# Patient Record
Sex: Male | Born: 1990 | Race: Black or African American | Hispanic: No | Marital: Single | Smoking: Never smoker
Health system: Southern US, Community
[De-identification: ages and names within clinical notes are randomized; demographics above are authoritative.]

## PROBLEM LIST (undated history)

## (undated) DIAGNOSIS — R1030 Lower abdominal pain, unspecified: Secondary | ICD-10-CM

## (undated) DIAGNOSIS — Z9889 Other specified postprocedural states: Secondary | ICD-10-CM

## (undated) DIAGNOSIS — Z8719 Personal history of other diseases of the digestive system: Secondary | ICD-10-CM

## (undated) HISTORY — DX: Personal history of other diseases of the digestive system: Z87.19

## (undated) HISTORY — DX: Lower abdominal pain, unspecified: R10.30

## (undated) HISTORY — DX: Other specified postprocedural states: Z98.890

---
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
## 2012-09-17 ENCOUNTER — Encounter (HOSPITAL_COMMUNITY): Payer: Self-pay | Admitting: *Deleted

## 2012-09-17 ENCOUNTER — Emergency Department (INDEPENDENT_AMBULATORY_CARE_PROVIDER_SITE_OTHER)
Admission: EM | Admit: 2012-09-17 | Discharge: 2012-09-17 | Disposition: A | Discharge status: Home / Self Care | Payer: Self-pay | Source: Home / Self Care

## 2012-09-17 ENCOUNTER — Emergency Department (INDEPENDENT_AMBULATORY_CARE_PROVIDER_SITE_OTHER): Payer: Self-pay

## 2012-09-17 DIAGNOSIS — S4980XA Other specified injuries of shoulder and upper arm, unspecified arm, initial encounter: Secondary | ICD-10-CM

## 2012-09-17 DIAGNOSIS — S46009A Unspecified injury of muscle(s) and tendon(s) of the rotator cuff of unspecified shoulder, initial encounter: Secondary | ICD-10-CM

## 2012-09-17 MED ORDER — OXYCODONE-ACETAMINOPHEN 5-325 MG PO TABS: 1.0000 | ORAL_TABLET | Freq: Four times a day (QID) | ORAL | Status: AC | PRN

## 2012-09-17 MED ORDER — KETOROLAC TROMETHAMINE 60 MG/2ML IM SOLN
60.0000 mg | Freq: Once | INTRAMUSCULAR | Status: AC
  Administered 2012-09-17: 60 mg via INTRAMUSCULAR

## 2012-09-17 MED ORDER — KETOROLAC TROMETHAMINE 60 MG/2ML IM SOLN
INTRAMUSCULAR | Status: AC
  Filled 2012-09-17: qty 2

## 2012-09-17 MED ORDER — NAPROXEN 500 MG PO TABS: 500.0000 mg | ORAL_TABLET | Freq: Two times a day (BID) | ORAL | Status: AC

## 2012-09-17 NOTE — ED Provider Notes (Signed)
CC:  Left shoulder injury:  HPI:  Shoulder hurting for 1 week but on Sunday (3 days ago), he was lifting a large box (about 50 lbs) overhead and felt his left shoulder bone pop out forward and then pop back in with severe pain. Pain started in back of shoulder/shoulder blade then moved to entire shoulder - anterior, posterior, superior. Pain is  8/10 now and radiates down arm. Numbness/stiffness in first and second digits left hand. Abduction past 90 degrees, flexion past 60-75 degrees cause pain. Pt has taken naproxen, which helps some. No past injuries to that shoulder.  No swelling.   PMH:  none PSH:  bilateral inguinal hernia repair Fam Hx:  Non-contributory Soc Hx:  Occasional cigarette, no alcohol or drugs.  ROS:  Negative except as stated in HPI.  MEDS:   No current facility-administered medications on file prior to encounter.   Current Outpatient Prescriptions on File Prior to Encounter  Medication Sig Dispense Refill  . cyclobenzaprine (FLEXERIL) 5 MG tablet Take 5 mg by mouth as needed.         No Known Allergies  PHYSICAL EXAM Filed Vitals:   09/17/12 1935  BP: 110/54  Pulse: 54  Temp: 98.2 F (36.8 C)  Resp: 14   GEN:  WNWD, no acute distress RRR, CTAB Musculoskeletal:  L shoulder tenderness posterior, anterior, and superior aspects of the joint and over the deltoid. There is no tenderness along the humerus, elbow, wrist or hand. Pain with passive and active abduction past about 80 degrees, flexion past about 60 degrees. Non pain with resisted internal rotation but some with resisted external rotation. Decreased sensation in left thumb and index finger to the wrist compared to right. But other digits, hand, forearm, upper arm and shoulder sensation normal. Full movement and strength of fingers. Normal pulses at wrist. Neck and back non-tender.  XRAY LEFT SHOULDER - 2+ VIEW  Comparison: None.  Findings: No evidence for fracture. No findings to suggest  shoulder  separation or dislocation. No worrisome lytic or sclerotic  osseous lesion.   IMPRESSION:  Normal exam.  Original Report Authenticated By: ERIC A. MANSELL, M.D.   Toradol 60 mg IM given in ED - minimal relief.  A/P 21 y.o. African American male with shoulder injury Rotator cuff vs labral tear from dislocation, shoulder instability -may need MRI. Finger numbness - likely injured nerve with transient dislocation No fracture or bony abnormality, no dislocation.  Percocet, naproxen for pain. F/U with PCP  Napoleon Form, MD  Napoleon Form, MD 09/17/12 2214

## 2012-09-17 NOTE — ED Notes (Signed)
Work note done as directed by Dr. Thad Ranger.

## 2012-09-17 NOTE — ED Notes (Signed)
Putting a box up on a shelf at work and L shoulder popped out on Sun.  He said it was hurting the Fri. AM before, but no known injury.  C/o decreased range of motion.  L radial pp.  Pain shoots down his arm to his hand.  Thumb feels stiff.

## 2013-01-19 ENCOUNTER — Telehealth (INDEPENDENT_AMBULATORY_CARE_PROVIDER_SITE_OTHER): Payer: Self-pay | Admitting: General Surgery

## 2013-01-19 NOTE — Telephone Encounter (Signed)
Pt called to ask for appt, but was redirected to his PCP.  Surgery for [facility] done in October 2012.  He describes pain on one side that comes and goes, is shooting in nature and also causes pain in his testicles.  Strongly recommended he contact his PCP for evaluation, as his is not bulging (hernia recurrence) and probably needs work-up.  Pt understands and states he will call his medical doctor.

## 2013-07-01 IMAGING — CR DG SHOULDER 2+V*L*
3 series · 3 of 3 positions shown · non-contrast
Comparison: None.

CLINICAL DATA: Left shoulder injury.

LEFT SHOULDER - 2+ VIEW

[view not recorded (1 of 3)]
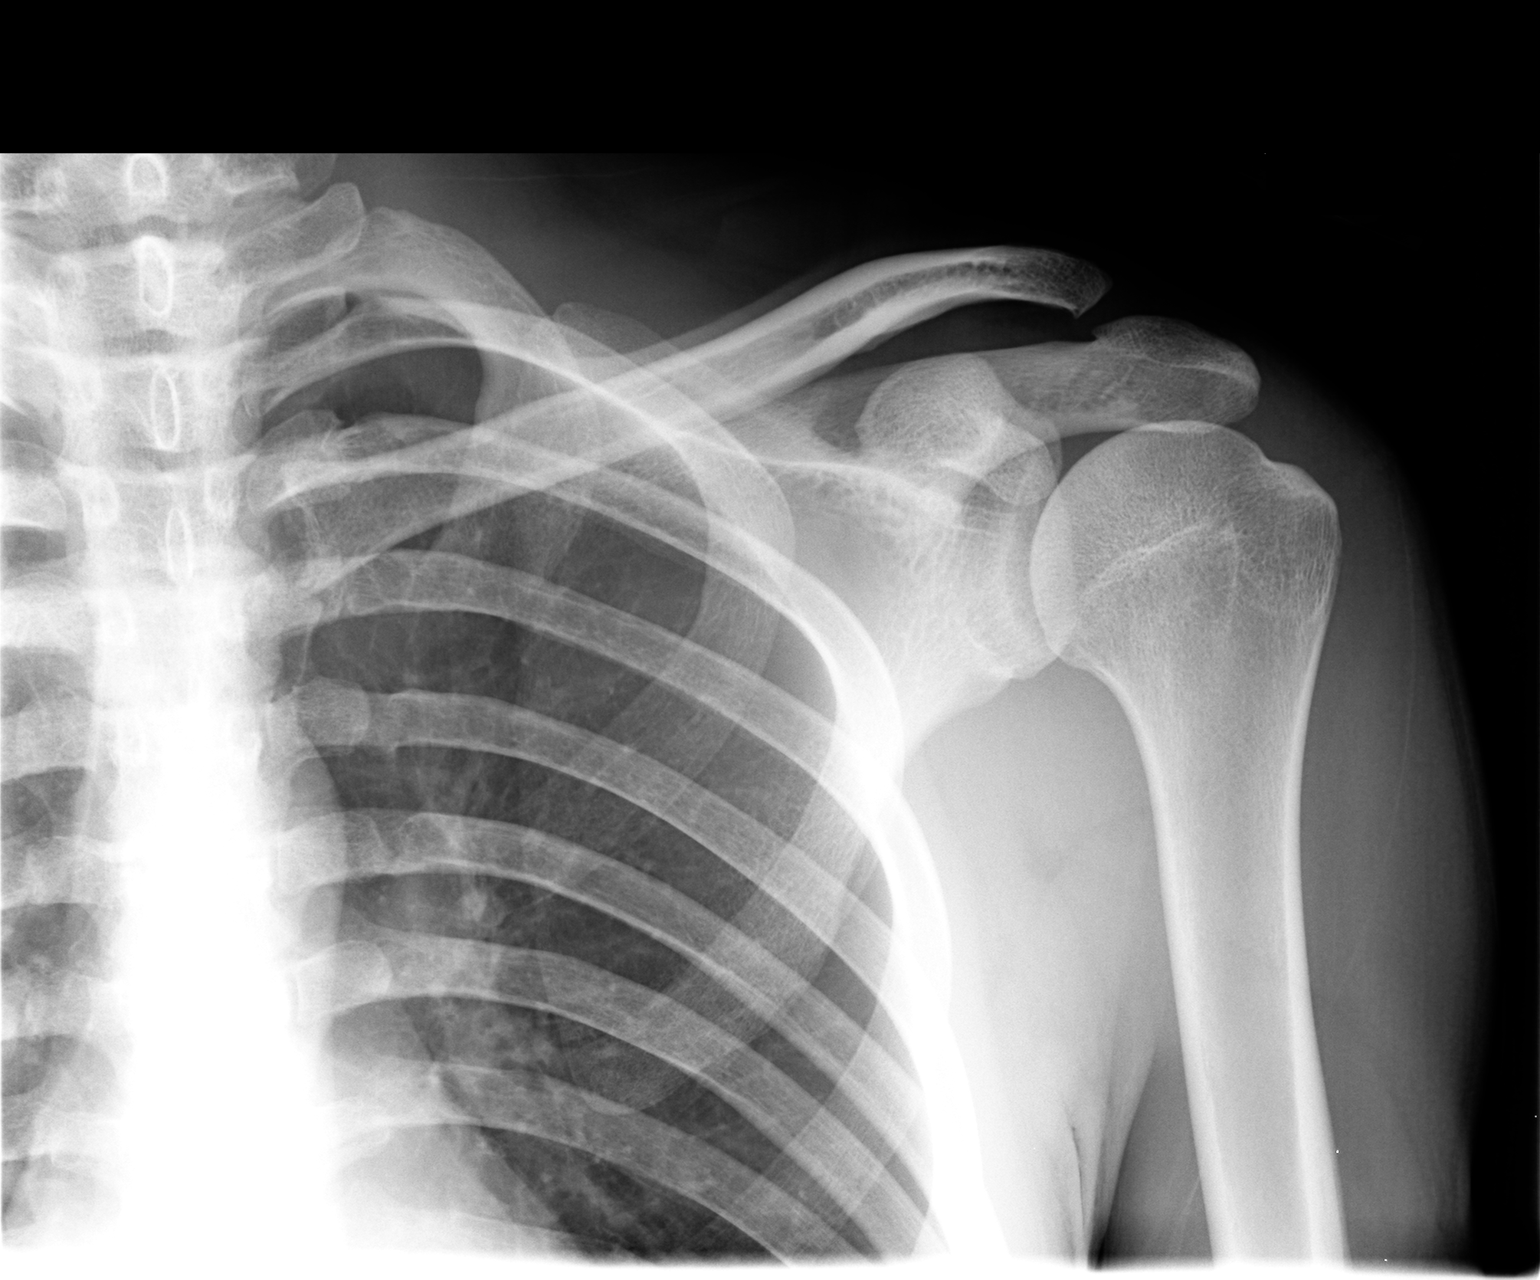

[view not recorded (2 of 3)]
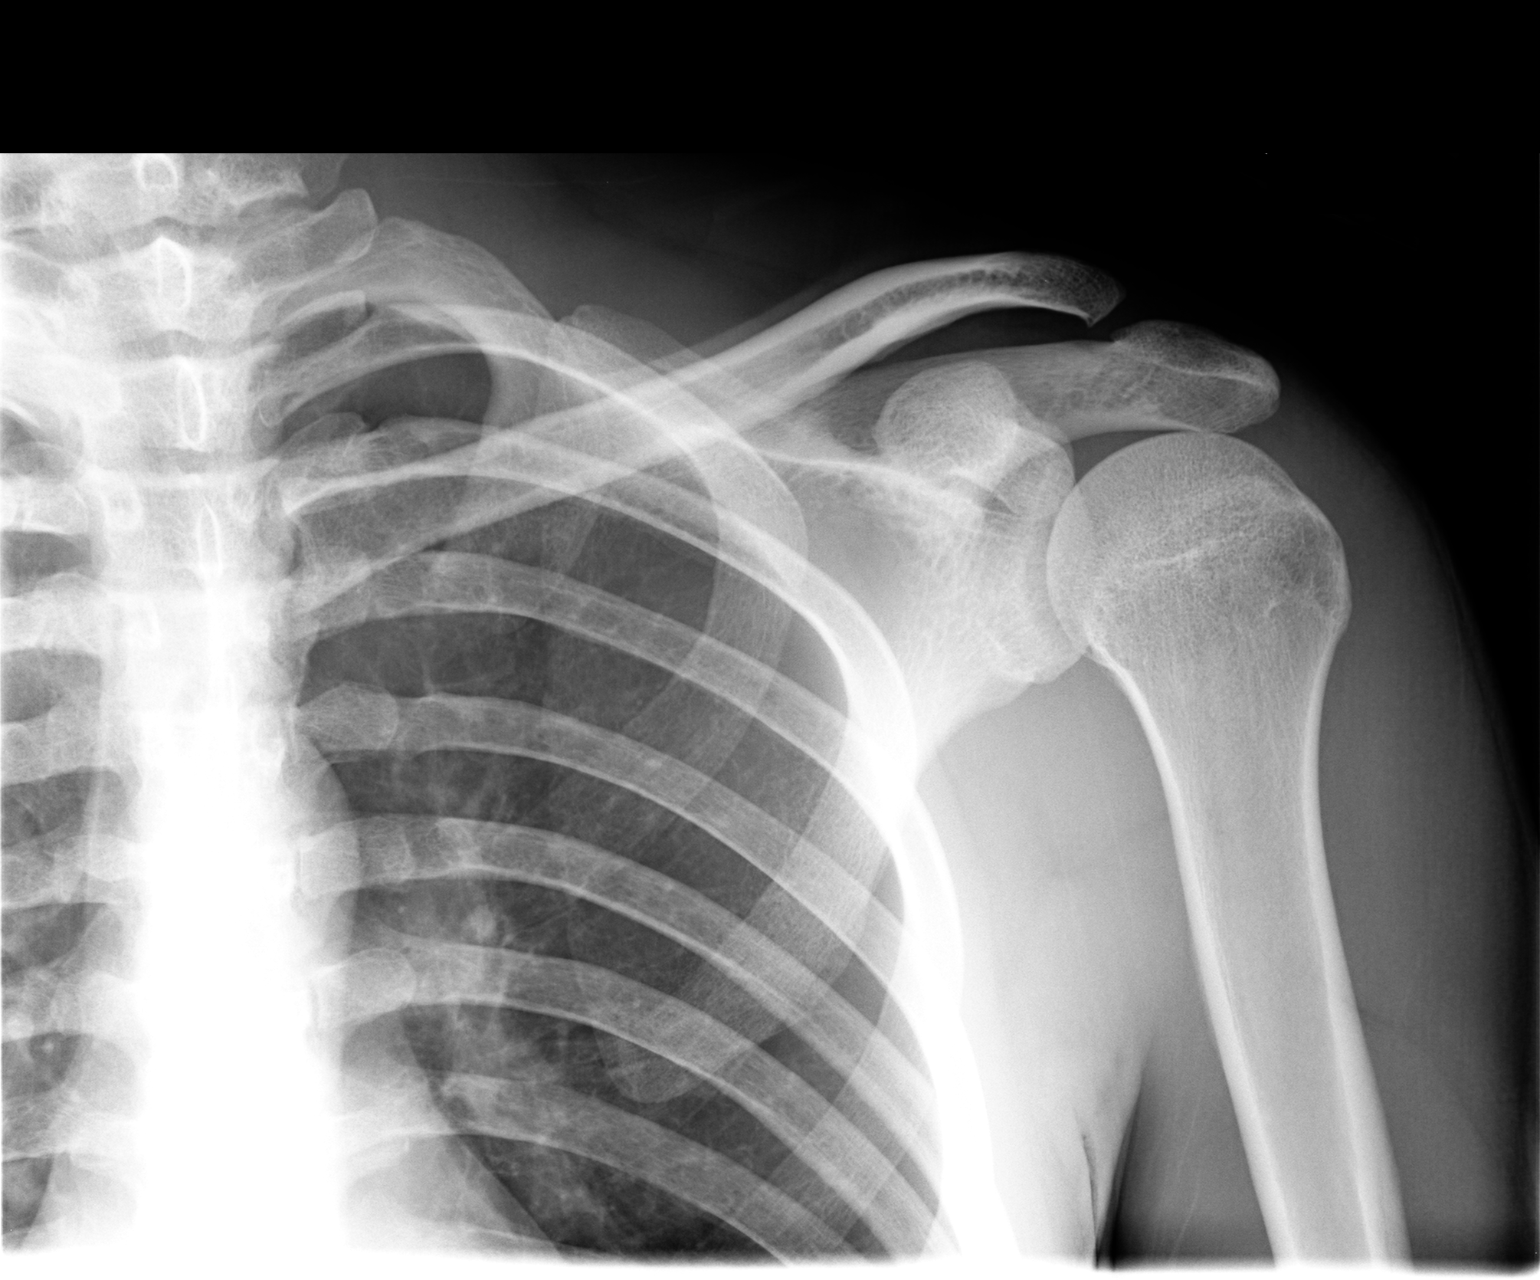

[view not recorded (3 of 3)]
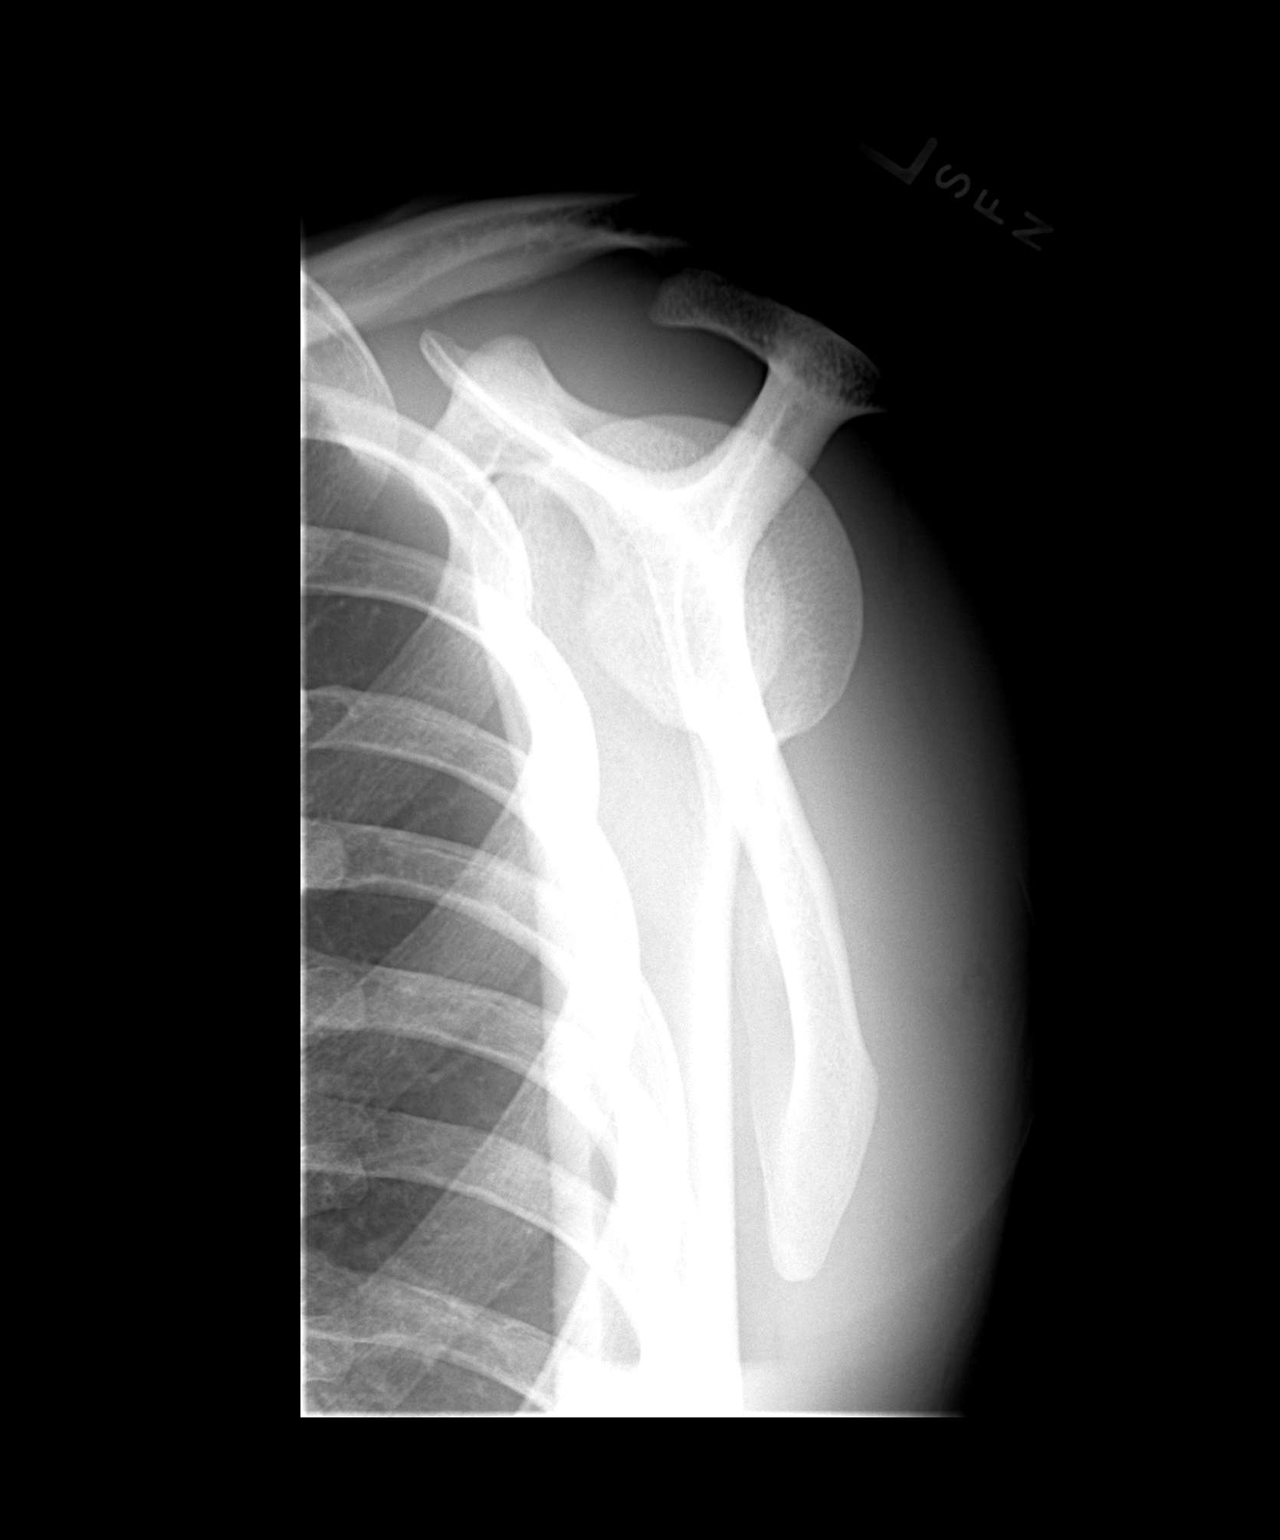

[3 of 3 positions shown; findings below may reference images not displayed]

FINDINGS: No evidence for fracture.  No findings to suggest
shoulder separation or dislocation. No worrisome lytic or sclerotic
osseous lesion.
IMPRESSION: Normal exam.

## 2016-08-28 ENCOUNTER — Emergency Department (HOSPITAL_COMMUNITY)
Admission: EM | Admit: 2016-08-28 | Discharge: 2016-08-28 | Disposition: A | Discharge status: Home / Self Care | Payer: Self-pay | Attending: Emergency Medicine | Admitting: Emergency Medicine

## 2016-08-28 ENCOUNTER — Encounter (HOSPITAL_COMMUNITY): Payer: Self-pay | Admitting: Emergency Medicine

## 2016-08-28 DIAGNOSIS — K625 Hemorrhage of anus and rectum: Secondary | ICD-10-CM | POA: Insufficient documentation

## 2016-08-28 DIAGNOSIS — Z5321 Procedure and treatment not carried out due to patient leaving prior to being seen by health care provider: Secondary | ICD-10-CM | POA: Insufficient documentation

## 2016-08-28 LAB — CBC
HEMATOCRIT: 41 % (ref 39.0–52.0)
HEMOGLOBIN: 14.3 g/dL (ref 13.0–17.0)
MCH: 31.8 pg (ref 26.0–34.0)
MCHC: 34.9 g/dL (ref 30.0–36.0)
MCV: 91.3 fL (ref 78.0–100.0)
Platelets: 237 10*3/uL (ref 150–400)
RBC: 4.49 MIL/uL (ref 4.22–5.81)
RDW: 12.7 % (ref 11.5–15.5)
WBC: 7.6 10*3/uL (ref 4.0–10.5)

## 2016-08-28 LAB — COMPREHENSIVE METABOLIC PANEL
ALBUMIN: 4.6 g/dL (ref 3.5–5.0)
ALT: 14 U/L — ABNORMAL LOW (ref 17–63)
ANION GAP: 8 (ref 5–15)
AST: 22 U/L (ref 15–41)
Alkaline Phosphatase: 59 U/L (ref 38–126)
BUN: 7 mg/dL (ref 6–20)
CO2: 25 mmol/L (ref 22–32)
Calcium: 9.8 mg/dL (ref 8.9–10.3)
Chloride: 106 mmol/L (ref 101–111)
Creatinine, Ser: 1.02 mg/dL (ref 0.61–1.24)
GFR calc Af Amer: >60 mL/min (ref >60)
GFR calc non Af Amer: >60 mL/min (ref >60)
GLUCOSE: 87 mg/dL (ref 65–99)
POTASSIUM: 3.6 mmol/L (ref 3.5–5.1)
SODIUM: 139 mmol/L (ref 135–145)
Total Bilirubin: 0.6 mg/dL (ref 0.3–1.2)
Total Protein: 7.3 g/dL (ref 6.5–8.1)

## 2016-08-28 NOTE — ED Notes (Signed)
Pt placed stickers and bp cuff on registration counter and said that he was leaving

## 2016-08-28 NOTE — ED Triage Notes (Signed)
Pt sts that he was having a BM today and stood up and saw bright red blood in toilet. Pt denies abd pain, emesis. Pt sts back pain.
